# Patient Record
Sex: Female | Born: 1975 | Race: Black or African American | Hispanic: No | Marital: Single | Smoking: Current every day smoker
Health system: Southern US, Community
[De-identification: ages and names within clinical notes are randomized; demographics above are authoritative.]

## PROBLEM LIST (undated history)

## (undated) HISTORY — PX: TUBAL LIGATION: SHX77

## (undated) HISTORY — PX: LAPAROSCOPIC GASTRIC BANDING: SHX1100

---

## 1997-12-02 ENCOUNTER — Inpatient Hospital Stay (HOSPITAL_COMMUNITY): Admission: AD | Admit: 1997-12-02 | Discharge: 1997-12-02 | Payer: Self-pay | Admitting: Obstetrics & Gynecology

## 1997-12-08 ENCOUNTER — Other Ambulatory Visit: Admission: RE | Admit: 1997-12-08 | Discharge: 1997-12-08 | Payer: Self-pay | Admitting: Obstetrics & Gynecology

## 1998-05-24 ENCOUNTER — Inpatient Hospital Stay (HOSPITAL_COMMUNITY): Admission: AD | Admit: 1998-05-24 | Discharge: 1998-05-27 | Payer: Self-pay | Admitting: Obstetrics and Gynecology

## 1999-09-16 ENCOUNTER — Emergency Department (HOSPITAL_COMMUNITY): Admission: EM | Admit: 1999-09-16 | Discharge: 1999-09-16 | Payer: Self-pay | Admitting: Family Medicine

## 2001-03-19 ENCOUNTER — Emergency Department (HOSPITAL_COMMUNITY): Admission: EM | Admit: 2001-03-19 | Discharge: 2001-03-19 | Payer: Self-pay | Admitting: Emergency Medicine

## 2001-08-13 ENCOUNTER — Emergency Department (HOSPITAL_COMMUNITY): Admission: EM | Admit: 2001-08-13 | Discharge: 2001-08-13 | Payer: Self-pay | Admitting: Emergency Medicine

## 2003-10-20 ENCOUNTER — Other Ambulatory Visit: Admission: RE | Admit: 2003-10-20 | Discharge: 2003-10-20 | Payer: Self-pay | Admitting: Obstetrics and Gynecology

## 2004-05-16 ENCOUNTER — Inpatient Hospital Stay (HOSPITAL_COMMUNITY): Admission: AD | Admit: 2004-05-16 | Discharge: 2004-05-16 | Payer: Self-pay | Admitting: *Deleted

## 2005-01-04 ENCOUNTER — Ambulatory Visit (HOSPITAL_COMMUNITY): Admission: RE | Admit: 2005-01-04 | Discharge: 2005-01-04 | Payer: Self-pay | Admitting: Obstetrics and Gynecology

## 2005-01-16 ENCOUNTER — Inpatient Hospital Stay (HOSPITAL_COMMUNITY): Admission: AD | Admit: 2005-01-16 | Discharge: 2005-01-19 | Payer: Self-pay | Admitting: Obstetrics and Gynecology

## 2005-01-17 ENCOUNTER — Encounter (INDEPENDENT_AMBULATORY_CARE_PROVIDER_SITE_OTHER): Payer: Self-pay | Admitting: *Deleted

## 2005-02-26 ENCOUNTER — Other Ambulatory Visit: Admission: RE | Admit: 2005-02-26 | Discharge: 2005-02-26 | Payer: Self-pay | Admitting: Obstetrics and Gynecology

## 2006-02-18 ENCOUNTER — Other Ambulatory Visit: Admission: RE | Admit: 2006-02-18 | Discharge: 2006-02-18 | Payer: Self-pay | Admitting: Family Medicine

## 2007-04-01 ENCOUNTER — Ambulatory Visit (HOSPITAL_COMMUNITY): Admission: RE | Admit: 2007-04-01 | Discharge: 2007-04-01 | Payer: Self-pay | Admitting: General Surgery

## 2007-05-13 ENCOUNTER — Encounter: Admission: RE | Admit: 2007-05-13 | Discharge: 2007-05-13 | Payer: Self-pay | Admitting: General Surgery

## 2007-05-21 ENCOUNTER — Other Ambulatory Visit: Admission: RE | Admit: 2007-05-21 | Discharge: 2007-05-21 | Payer: Self-pay | Admitting: Family Medicine

## 2007-09-24 ENCOUNTER — Encounter: Admission: RE | Admit: 2007-09-24 | Discharge: 2007-12-23 | Payer: Self-pay | Admitting: General Surgery

## 2007-10-07 ENCOUNTER — Ambulatory Visit (HOSPITAL_COMMUNITY): Admission: RE | Admit: 2007-10-07 | Discharge: 2007-10-08 | Payer: Self-pay | Admitting: General Surgery

## 2010-11-14 NOTE — Op Note (Signed)
NAMEANARELY, NICHOLLS             ACCOUNT NO.:  [id number]   MEDICAL RECORD NO.:  [id number]          PATIENT TYPE:  AMB   LOCATION:  DAY                          FACILITY:  [facility]   PHYSICIAN:  Sharlet Salina T. Hoxworth, M.D.DATE OF BIRTH:  May 18, 1976   DATE OF PROCEDURE:  10/07/2007  DATE OF DISCHARGE:                               OPERATIVE REPORT   PREOPERATIVE DIAGNOSES:  Morbid obesity.   POSTOPERATIVE DIAGNOSES:  Morbid obesity.   SURGICAL PROCEDURES:  Laparoscopic adjustable gastric band placement  with hiatal hernia repair.   SURGEON:  Dr. Glenna Fellows.   ASSISTANT:  Dr. Ovidio Kin.   ANESTHESIA:  General.   BRIEF HISTORY:  Ms. Poncedeleon is a 35 year old female with progressive  morbid obesity that is unresponsive to multiple attempts at nonsurgical  management.  After extensive discussion of options and workup detailed  elsewhere, we have elected to proceed with placement of laparoscopic  adjustable gastric band.  She is brought to the operating room for this  procedure.   DESCRIPTION OF PROCEDURE:  The patient was brought to the operating  room, placed in supine position on the operating table and general  endotracheal anesthesia was induced.  The abdomen was widely sterilely  prepped and draped.  She was given preoperative antibiotics.  Heparin  was administered subcutaneously.  PAS were in place.  The correct  patient and procedure were verified.  Incisions were infiltrated with  local anesthesia.  Through a 1-cm site at the left subcostal area mid  clavicular line, access was obtained with an OptiVu trocar without  difficulty and pneumoperitoneum established.  Under direct vision, a 15  mm trocar was placed in the right upper abdomen through the falciform  ligament and an 11 mm trocar in the right upper mid abdomen, another 11  mm trocar just to the left above the umbilicus for the camera port.  Through a 5-mm subxiphoid site, the [facility] retractor was  placed and  the left lobe of the liver elevated with excellent exposure of the upper  stomach and hiatus.  A 5 mm trocar was placed in the left flank. The  peritoneum overlying the left crus at the EG junction was incised and  careful blunt dissection was carried back toward the retrogastric area.  Following this, the pars flaccida was incised in the avascular area.  There was actually minimal fat up around the hiatus and upper stomach.  An APS band system was chosen.  The sizing tube was introduced orally  into the stomach and the balloon blown up to 15 cm and pulled back was  seen to go up through the hiatus through a small hiatal hernia.  I  therefore proceeded to repair this.  The anterior border of the right  crus was mobilized toward the esophagus, the retroesophageal space  entered and the left crus identified, dissected up toward the esophagus  and hiatal hernia well-defined which was small but definite.  This was  repaired with a single 2-0 Ethibond endostitch suture with closure back  toward a normal size.  Following this, the tube was introduced back into  the  stomach, the balloon inflated and this time pulled snugly back into  the hiatus without going through.  The balloon was deflated and the tube  pulled back into the upper esophagus.  A point on the lower right crus  of crossing fat was identified, the peritoneum incised and the finger  dissector passed easily into this space retrogastric and deployed up  through the previous dissected area of angle of Hiss without difficulty.  An APS flush band system was introduced in the abdomen, the tubing  brought behind the stomach with the finger tractor and the band brought  behind the stomach without difficulty.  With the sizing tube back in  place through the EG junction,  the band was locked into place without  any undue tightness.  The sizing balloon was removed.  Beginning near  the angle of Hiss, the fundus was imbricated up  over the band to the  small gastric pouch with three interrupted 2-0 Ethibond sutures.  The  band appeared to be in excellent position.  There was no bleeding.  There was no evidence of trocar injury.  The Nathanson retractor was  removed.  Trocars removed and all CO2 evacuated after bringing the  tubing out through the right mid abdominal trocar site.  This incision  was lengthened somewhat and the anterior fascia exposed.  Four 2-0  Prolene stay sutures were placed.  The tubing was cut, attached to the  port and then the port sutured to the anterior abdominal wall with the  previously placed sutures. This wound was irrigated.  The subcu at this  site was closed with running 3-0 Vicryl.  The skin incisions were closed  with subcuticular Monocryl with Dermabond. Sponge, needle and instrument  counts were correct.  The patient was taken to recovery in good  condition.      Lorne Skeens. Hoxworth, M.D.  Electronically Signed     BTH/MEDQ  D:  10/07/2007  T:  10/08/2007  Job:  [id number]

## 2010-11-17 NOTE — Discharge Summary (Signed)
Kaitlin Gilmore, Kaitlin Gilmore             ACCOUNT NO.:  [id number]   MEDICAL RECORD NO.:  [id number]          PATIENT TYPE:  INP   LOCATION:  9134                          FACILITY:  [facility]   PHYSICIAN:  Naima A. Dillard, M.D. DATE OF BIRTH:  [birth year]   DATE OF ADMISSION:  01/16/2005  DATE OF DISCHARGE:  01/19/2005                                 DISCHARGE SUMMARY   ADMISSION DIAGNOSES:  1.  Intrauterine pregnancy at 40-4/7 weeks.  2.  Early labor.  3.  Spontaneous rupture of membranes.   DISCHARGE DIAGNOSES:  1.  Intrauterine pregnancy at 40-4/7 weeks.  2.  Early labor.  3.  Spontaneous rupture of membranes.  4.  Status post spontaneous vaginal delivery.  5.  Retained placental fragment with manual removal.  6.  Desires elective tubal ligation.   HOSPITAL PROCEDURES:  1.  Electronic fetal monitoring.  2.  Spontaneous vaginal delivery of a viable female infant named T'Nijah.      Apgars 8 and 9 weighing 8 pounds 6 ounces.  3.  Removal of retained placental fragment.  4.  Epidural anesthesia.  5.  Postpartum tubal ligation.   HOSPITAL COURSE:  Patient was admitted in early labor.  While walking her  membranes ruptured and her cervix progressed to 4.5 cm.  She progressed  steadily throughout the evening to vaginal delivery of a viable female  infant, Apgars 8 and 9 weighing 8 pounds 6 ounces remarkable for tight  shoulders requiring McRoberts maneuver that were delivered without incident.  She did have a retained placental fragment with bleeding and this was  removed manually with no further complications afterwards.  She had a small  midline first degree laceration which was repaired.  She was requesting  elective tubal ligation and this was performed under epidural anesthesia  without complications.  On postpartum day #1 she was doing well, up ad lib,  bottle feeding her infant, tolerating a regular diet.  Hemoglobin was 9.5  and she was given routine postpartum and postoperative  care.  On  postoperative day #2 she continued to improve.  Vital signs were stable.  Chest was clear.  Heart rate regular rate and rhythm.  Abdomen soft and  appropriately tender.  Incision was clean, dry, and intact.  Lochia was  small.  Extremities within normal limits.  She was deemed to have received  the full benefit of her hospital stay and was discharged home.   DISCHARGE MEDICATIONS:  1.  Motrin 600 mg p.o. q.6h. p.r.n.  2.  Tylox one to two p.o. q.4h. p.r.n.   DISCHARGE LABORATORIES:  White blood cell count 10.1, hemoglobin 9.5,  platelets 244.  RPR nonreactive.   DISCHARGE INSTRUCTIONS:  Per [facility] handout.   DISCHARGE FOLLOW-UP:  In six weeks or p.r.n.       MLW/MEDQ  D:  01/19/2005  T:  01/19/2005  Job:  [id number]

## 2010-11-17 NOTE — Op Note (Signed)
Kaitlin Gilmore, Kaitlin Gilmore             ACCOUNT NO.:  [id number]   MEDICAL RECORD NO.:  [id number]          PATIENT TYPE:  INP   LOCATION:  9134                          FACILITY:  [facility]   PHYSICIAN:  Naima A. Dillard, M.D. DATE OF BIRTH:  07-Jul-1975   DATE OF PROCEDURE:  01/17/2005  DATE OF DISCHARGE:                                 OPERATIVE REPORT   PREOPERATIVE DIAGNOSIS:  Multiparity, desires tubal ligation.   POSTOPERATIVE DIAGNOSIS:  Multiparity, desires tubal ligation.   PROCEDURE:  Postpartum tubal ligation.   SURGEON:  Naima A. Normand Sloop, M.D.   ANESTHESIA:  Epidural.   ESTIMATED BLOOD LOSS:  Minimal.   IV FLUIDS:  1100 mL crystalloid.   COMPLICATIONS:  None.   FINDINGS:  Normal-appearing fallopian tubes bilaterally.  Before the  procedure, the patient was told that this procedure was meant to be  permanent.  The risks were but not limited to bleeding, infection, damage to  internal organs such as bowel, bladder and major blood vessels, and a  failure rate of about one in 200 to one in 300, and half of the failures  could result in ectopic pregnancy, which the patient understood could be a  pregnancy in her tube.   PROCEDURE IN DETAIL:  The patient was taken to the operating room.  Her  epidural anesthesia was found to be adequate.  She was prepped and draped in  a normal sterile fashion.  A 10 mm infraumbilical horizontal incision was  made with a scalpel and carried down to the fascia.  The fascia was incised  in the midline and extended bilaterally.  The peritoneum was identified,  tented up and entered sharply.  It was extended bilaterally.  Appendiceal  retractor was placed into the abdominal cavity and the patient's right  fallopian tube was identified, grasped with Babcock clamps, followed out to  the fimbriated end, and about a centimeter of the midisthmic portion of the  tube was ligated with 2-0 plain and excised.  Hemostasis was assured.  The  patient's left  fallopian tube was identified in a similar fashion, grasped  with a Babcock clamp, and about 1 cm of the midisthmic portion of the tube  was ligated and excised with 2-0 plain.  Hemostasis was assured.  The tubes  were returned to the abdomen.  The fascia was closed with 0 Vicryl.  The  skin was closed with 3-0 Monocryl in a subcuticular fashion.  The sponge,  lap and needle counts were correct x2.  The patient went to the recovery  room in stable condition.     NAD/MEDQ  D:  01/17/2005  T:  01/17/2005  Job:  [id number]

## 2010-11-17 NOTE — H&P (Signed)
NAMEJISELLA, ASHENFELTER             ACCOUNT NO.:  [id number]   MEDICAL RECORD NO.:  [id number]          PATIENT TYPE:  INP   LOCATION:  9169                          FACILITY:  [facility]   PHYSICIAN:  Osborn Coho, M.D.   DATE OF BIRTH:  10/15/1975   DATE OF ADMISSION:  01/16/2005  DATE OF DISCHARGE:                                HISTORY & PHYSICAL   This is a 35 year old gravida 3, para 0-1-1-1, at 40-4/7 weeks who presented  with contractions every 5 minutes for several hours.  Cervix was 2 cm on  admission.  She was sent to walk for an hour.  During her walking, she  ruptured her membranes for clear fluid, and her cervix is now 4.5 cm, and  she is therefore being admitted for labor.   OBSTETRICAL HISTORY:  Remarkable for a vaginal delivery in 1999 of a female  infant at 79 weeks' gestation, weighing 5 pounds 5 ounces, remarkable for  induction of labor secondary to preeclampsia.  She had an elective abortion  in 2001.   MEDICAL HISTORY:  1.  Remarkable for gonorrhea and Chlamydia 5 years ago and second trimester      Chlamydia during this pregnancy.  2.  She had childhood Varicella.  3.  She was a smoker until the beginning of this pregnancy.   FAMILY HISTORY:  Remarkable for grandmother, grandfather on both sides with  hypertension.  Mother with epilepsy.  Mother and father with crack use and  alcohol.   SURGICAL HISTORY:  Elective AB in 2001.   GENETIC HISTORY:  Remarkable for father of the baby's brother with twins.   SOCIAL HISTORY:  The patient is single.  Father of the baby, Laurey Arrow,  who is involved and supportive.  She does not report a religious  affiliation.  She denies any current alcohol, tobacco, or drug use.   PRENATAL LABS:  Hemoglobin 12.5, platelets 311, blood type O positive,  antibody screen negative, sickle cell negative, RPR nonreactive, rubella  immune, hepatitis negative, HIV negative, Pap test normal, gonorrhea  negative, Chlamydia negative,  cystic fibrosis negative.   HISTORY OF CURRENT PREGNANCY:  The patient entered care at 10 weeks'  gestation.  She was diagnosed with Chlamydia in January at 15 weeks  pregnancy and treated appropriately.  Test of cure was negative 3 weeks  later.  Anatomy ultrasound was normal.  She had another ultrasound at 28  weeks for size greater than dates, and the baby measured 79th percentile.  Glucola was elevated, and 3 hour GTT was normal.  She had another ultrasound  at 34 weeks for size greater than dates and measured only slightly above.  She had another ultrasound at 37 weeks for presentation and growth, and she  measured 59-65th percentile.  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   OBJECTIVE DATA:  VITAL SIGNS:  Stable, afebrile.  HEENT:  Within normal limits.  Thyroid normal and not enlarged.  CHEST:  Clear  to auscultation.  HEART:  Regular rate and rhythm.  ABDOMEN:  Gravid at 40 cm, vertex, Leopold's.  EFM shows fetal heart rate  which is reactive with no decelerations.  Uterine contractions are every 3  minutes and moderately strong.  Cervix is now 4.5 cm, 90% effaced, -2  station, vertex presentation.  EXTREMITIES:  Within normal limits.   ASSESSMENT:  1.  Intrauterine pregnancy at 40-4/7 weeks.  2.  Active labor.  3.  Spontaneous rupture of membranes.  4.  Group B strep negative.   PLAN:  1.  Admit to birthing suite, Dr. Su Hilt notified.  2.  Routine CNM orders.  3.  Epidural p.r.n.       MLW/MEDQ  D:  01/16/2005  T:  01/16/2005  Job:  [id number]

## 2011-01-06 ENCOUNTER — Inpatient Hospital Stay (INDEPENDENT_AMBULATORY_CARE_PROVIDER_SITE_OTHER)
Admission: RE | Admit: 2011-01-06 | Discharge: 2011-01-06 | Disposition: A | Discharge status: Home / Self Care | Payer: PRIVATE HEALTH INSURANCE | Source: Ambulatory Visit | Attending: Emergency Medicine | Admitting: Emergency Medicine

## 2011-01-06 DIAGNOSIS — K5289 Other specified noninfective gastroenteritis and colitis: Secondary | ICD-10-CM

## 2011-01-06 LAB — DIFFERENTIAL
Basophils Absolute: 0.1 10*3/uL (ref 0.0–0.1)
Basophils Relative: 1 % (ref 0–1)
Eosinophils Absolute: 0.1 10*3/uL (ref 0.0–0.7)
Eosinophils Relative: 2 % (ref 0–5)
Lymphocytes Relative: 51 % — ABNORMAL HIGH (ref 12–46)
Lymphs Abs: 2.8 10*3/uL (ref 0.7–4.0)
Monocytes Absolute: 0.5 10*3/uL (ref 0.1–1.0)
Monocytes Relative: 9 % (ref 3–12)
Neutro Abs: 2 10*3/uL (ref 1.7–7.7)
Neutrophils Relative %: 36 % — ABNORMAL LOW (ref 43–77)

## 2011-01-06 LAB — POCT I-STAT, CHEM 8
BUN: 9 mg/dL (ref 6–23)
Calcium, Ion: 1.16 mmol/L (ref 1.12–1.32)
Chloride: 102 mEq/L (ref 96–112)
Creatinine, Ser: 0.8 mg/dL (ref 0.50–1.10)
Glucose, Bld: 78 mg/dL (ref 70–99)
HCT: 40 % (ref 36.0–46.0)
Hemoglobin: 13.6 g/dL (ref 12.0–15.0)
Potassium: 3.8 mEq/L (ref 3.5–5.1)
Sodium: 138 mEq/L (ref 135–145)
TCO2: 26 mmol/L (ref 0–100)

## 2011-01-06 LAB — CBC
HCT: 35.4 % — ABNORMAL LOW (ref 36.0–46.0)
Hemoglobin: 12.1 g/dL (ref 12.0–15.0)
MCH: 29.2 pg (ref 26.0–34.0)
MCHC: 34.2 g/dL (ref 30.0–36.0)
MCV: 85.3 fL (ref 78.0–100.0)
Platelets: 286 10*3/uL (ref 150–400)
RBC: 4.15 MIL/uL (ref 3.87–5.11)
RDW: 14.4 % (ref 11.5–15.5)
WBC: 5.4 10*3/uL (ref 4.0–10.5)

## 2011-01-06 LAB — POCT URINALYSIS DIP (DEVICE)
Bilirubin Urine: NEGATIVE
Glucose, UA: NEGATIVE mg/dL
Hgb urine dipstick: NEGATIVE
Ketones, ur: NEGATIVE mg/dL
Leukocytes, UA: NEGATIVE
Nitrite: NEGATIVE
Protein, ur: NEGATIVE mg/dL
Specific Gravity, Urine: 1.02 (ref 1.005–1.030)
Urobilinogen, UA: 1 mg/dL (ref 0.0–1.0)
pH: 7 (ref 5.0–8.0)

## 2011-01-06 LAB — POCT PREGNANCY, URINE: Preg Test, Ur: NEGATIVE

## 2011-03-27 LAB — CBC
HCT: 36.8
Hemoglobin: 12.8
MCHC: 34.8
MCV: 86.6
Platelets: 271
RBC: 4.25
RDW: 13.9
WBC: 7.3

## 2011-03-27 LAB — DIFFERENTIAL
Basophils Absolute: 0
Basophils Relative: 0
Eosinophils Absolute: 0
Eosinophils Relative: 0
Lymphocytes Relative: 12
Lymphs Abs: 0.9
Monocytes Absolute: 0.2
Monocytes Relative: 3
Neutro Abs: 6.2
Neutrophils Relative %: 85 — ABNORMAL HIGH

## 2011-03-27 LAB — HEMOGLOBIN AND HEMATOCRIT, BLOOD
HCT: 39.1
Hemoglobin: 13.3

## 2011-03-27 LAB — PREGNANCY, URINE: Preg Test, Ur: NEGATIVE

## 2011-04-18 ENCOUNTER — Inpatient Hospital Stay (INDEPENDENT_AMBULATORY_CARE_PROVIDER_SITE_OTHER)
Admission: RE | Admit: 2011-04-18 | Discharge: 2011-04-18 | Disposition: A | Discharge status: Home / Self Care | Payer: PRIVATE HEALTH INSURANCE | Source: Ambulatory Visit | Attending: Family Medicine | Admitting: Family Medicine

## 2011-04-18 DIAGNOSIS — J069 Acute upper respiratory infection, unspecified: Secondary | ICD-10-CM

## 2012-07-03 ENCOUNTER — Telehealth (INDEPENDENT_AMBULATORY_CARE_PROVIDER_SITE_OTHER): Payer: Self-pay | Admitting: General Surgery

## 2012-07-03 NOTE — Telephone Encounter (Signed)
05/23/12 mailed recall letter for bariatric surgery follow-up to pt. Advised pt to call [facility] at [phone] to °schedule appt. (lss) ° °

## 2014-09-28 ENCOUNTER — Other Ambulatory Visit (HOSPITAL_COMMUNITY)
Admission: RE | Admit: 2014-09-28 | Discharge: 2014-09-28 | Disposition: A | Discharge status: Home / Self Care | Payer: 59 | Source: Ambulatory Visit | Attending: Family Medicine | Admitting: Family Medicine

## 2014-09-28 ENCOUNTER — Other Ambulatory Visit: Payer: Self-pay | Admitting: Family Medicine

## 2014-09-28 DIAGNOSIS — Z01419 Encounter for gynecological examination (general) (routine) without abnormal findings: Secondary | ICD-10-CM | POA: Diagnosis not present

## 2014-09-30 LAB — CYTOLOGY - PAP

## 2015-07-02 ENCOUNTER — Emergency Department (HOSPITAL_COMMUNITY)
Admission: EM | Admit: 2015-07-02 | Discharge: 2015-07-02 | Disposition: A | Discharge status: Home / Self Care | Payer: 59 | Attending: Emergency Medicine | Admitting: Emergency Medicine

## 2015-07-02 ENCOUNTER — Emergency Department (HOSPITAL_COMMUNITY): Payer: 59

## 2015-07-02 ENCOUNTER — Encounter (HOSPITAL_COMMUNITY): Payer: Self-pay | Admitting: Emergency Medicine

## 2015-07-02 ENCOUNTER — Emergency Department (INDEPENDENT_AMBULATORY_CARE_PROVIDER_SITE_OTHER)
Admission: EM | Admit: 2015-07-02 | Discharge: 2015-07-02 | Disposition: A | Discharge status: Nursing Home (Includes ICF) | Payer: 59 | Source: Home / Self Care

## 2015-07-02 DIAGNOSIS — R109 Unspecified abdominal pain: Secondary | ICD-10-CM

## 2015-07-02 DIAGNOSIS — R1031 Right lower quadrant pain: Secondary | ICD-10-CM

## 2015-07-02 DIAGNOSIS — F17210 Nicotine dependence, cigarettes, uncomplicated: Secondary | ICD-10-CM | POA: Diagnosis not present

## 2015-07-02 DIAGNOSIS — Z79899 Other long term (current) drug therapy: Secondary | ICD-10-CM | POA: Diagnosis not present

## 2015-07-02 DIAGNOSIS — R110 Nausea: Secondary | ICD-10-CM | POA: Insufficient documentation

## 2015-07-02 LAB — URINE MICROSCOPIC-ADD ON

## 2015-07-02 LAB — COMPREHENSIVE METABOLIC PANEL
ALT: 14 U/L (ref 14–54)
AST: 21 U/L (ref 15–41)
Albumin: 3.8 g/dL (ref 3.5–5.0)
Alkaline Phosphatase: 54 U/L (ref 38–126)
Anion gap: 8 (ref 5–15)
BUN: 9 mg/dL (ref 6–20)
CO2: 25 mmol/L (ref 22–32)
Calcium: 9 mg/dL (ref 8.9–10.3)
Chloride: 106 mmol/L (ref 101–111)
Creatinine, Ser: 0.85 mg/dL (ref 0.44–1.00)
GFR calc Af Amer: >60 mL/min (ref >60)
GFR calc non Af Amer: >60 mL/min (ref >60)
Glucose, Bld: 125 mg/dL — ABNORMAL HIGH (ref 65–99)
Potassium: 3.8 mmol/L (ref 3.5–5.1)
Sodium: 139 mmol/L (ref 135–145)
Total Bilirubin: 0.4 mg/dL (ref 0.3–1.2)
Total Protein: 7.2 g/dL (ref 6.5–8.1)

## 2015-07-02 LAB — PREGNANCY, URINE: Preg Test, Ur: NEGATIVE

## 2015-07-02 LAB — CBC
HCT: 32.8 % — ABNORMAL LOW (ref 36.0–46.0)
Hemoglobin: 10 g/dL — ABNORMAL LOW (ref 12.0–15.0)
MCH: 23.2 pg — ABNORMAL LOW (ref 26.0–34.0)
MCHC: 30.5 g/dL (ref 30.0–36.0)
MCV: 76.1 fL — ABNORMAL LOW (ref 78.0–100.0)
Platelets: 303 10*3/uL (ref 150–400)
RBC: 4.31 MIL/uL (ref 3.87–5.11)
RDW: 20.4 % — ABNORMAL HIGH (ref 11.5–15.5)
WBC: 13.3 10*3/uL — ABNORMAL HIGH (ref 4.0–10.5)

## 2015-07-02 LAB — URINALYSIS, ROUTINE W REFLEX MICROSCOPIC
Bilirubin Urine: NEGATIVE
Glucose, UA: NEGATIVE mg/dL
Ketones, ur: 15 mg/dL — AB
Nitrite: NEGATIVE
Protein, ur: NEGATIVE mg/dL
Specific Gravity, Urine: 1.022 (ref 1.005–1.030)
pH: 6 (ref 5.0–8.0)

## 2015-07-02 LAB — LIPASE, BLOOD: Lipase: 19 U/L (ref 11–51)

## 2015-07-02 LAB — POC URINE PREG, ED: Preg Test, Ur: NEGATIVE

## 2015-07-02 MED ORDER — FUROSEMIDE 10 MG/ML IJ SOLN
40.0000 mg | Freq: Once | INTRAMUSCULAR | Status: AC
  Administered 2015-07-02: 40 mg via INTRAVENOUS
  Filled 2015-07-02: qty 4

## 2015-07-02 MED ORDER — IOHEXOL 300 MG/ML  SOLN
100.0000 mL | Freq: Once | INTRAMUSCULAR | Status: AC | PRN
  Administered 2015-07-02: 75 mL via INTRAVENOUS

## 2015-07-02 MED ORDER — PANTOPRAZOLE SODIUM 40 MG IV SOLR: 40.0000 mg | Freq: Once | INTRAVENOUS | Status: DC

## 2015-07-02 MED ORDER — KETOROLAC TROMETHAMINE 15 MG/ML IJ SOLN
15.0000 mg | Freq: Once | INTRAMUSCULAR | Status: AC
  Administered 2015-07-02: 15 mg via INTRAVENOUS
  Filled 2015-07-02: qty 1

## 2015-07-02 MED ORDER — GI COCKTAIL ~~LOC~~: 30.0000 mL | Freq: Once | ORAL | Status: DC

## 2015-07-02 MED ORDER — HYDROMORPHONE HCL 1 MG/ML IJ SOLN: 1.0000 mg | Freq: Once | INTRAMUSCULAR | Status: DC

## 2015-07-02 MED ORDER — HYDROMORPHONE HCL 1 MG/ML IJ SOLN
1.0000 mg | Freq: Once | INTRAMUSCULAR | Status: AC
  Administered 2015-07-02: 1 mg via INTRAVENOUS
  Filled 2015-07-02: qty 1

## 2015-07-02 MED ORDER — SODIUM CHLORIDE 0.9 % IV BOLUS (SEPSIS): 1000.0000 mL | Freq: Once | INTRAVENOUS | Status: DC

## 2015-07-02 MED ORDER — SODIUM CHLORIDE 0.9 % IV BOLUS (SEPSIS)
1000.0000 mL | Freq: Once | INTRAVENOUS | Status: AC
  Administered 2015-07-02: 1000 mL via INTRAVENOUS

## 2015-07-02 MED ORDER — FUROSEMIDE 20 MG PO TABS: 20.0000 mg | ORAL_TABLET | Freq: Two times a day (BID) | ORAL | Status: AC

## 2015-07-02 MED ORDER — FAMOTIDINE IN NACL 20-0.9 MG/50ML-% IV SOLN: 20.0000 mg | Freq: Once | INTRAVENOUS | Status: DC

## 2015-07-02 MED ORDER — TRAMADOL HCL 50 MG PO TABS: 50.0000 mg | ORAL_TABLET | Freq: Four times a day (QID) | ORAL | Status: AC | PRN

## 2015-07-02 NOTE — ED Notes (Signed)
[facility] with a complaint of lower right abdominal pain that started today. The patient stated that she has had abnormal menstrual cycles.

## 2015-07-02 NOTE — Discharge Instructions (Signed)
YOU NEED TO GO TO THE ER FOR EVALUATION

## 2015-07-02 NOTE — ED Provider Notes (Signed)
CSN: [id number]     Arrival date & time 07/02/15  1118 History   None    Chief Complaint  Patient presents with  . Abdominal Pain   (Consider location/radiation/quality/duration/timing/severity/associated sxs/prior Treatment) HPI Tender right LQ pain in this adult female with onset of symptoms this morning. Pain score is 8 and causes crying. Onset of menses 2 days ago, and menses very heavy Previous Gastric banding and tubal ligation  No past medical history on file. Past Surgical History  Procedure Laterality Date  . Laparoscopic gastric banding    . Tubal ligation     No family history on file. Social History  Substance Use Topics  . Smoking status: Current Every Day Smoker -- 0.50 packs/day    Types: Cigarettes  . Smokeless tobacco: Not on file  . Alcohol Use: Yes     Comment: socially   OB History    No data available     Review of Systems Abdominal pain No vomiting, diarrhea Allergies  Review of patient's allergies indicates no known allergies.  Home Medications   Prior to Admission medications   Medication Sig Start Date End Date Taking? Authorizing Provider  ferrous sulfate 325 (65 FE) MG EC tablet Take 325 mg by mouth 3 (three) times daily with meals.   Yes Historical Provider, MD   Meds Ordered and Administered this Visit  Medications - No data to display  BP 150/89 mmHg  Pulse 69  Temp(Src) 97.6 F (36.4 C) (Oral)  Resp 20  SpO2 100%  LMP 06/29/2015 (Exact Date) No data found.   Physical Exam  Constitutional: She is oriented to person, place, and time. She appears well-developed and well-nourished. She appears distressed.  HENT:  Head: Atraumatic.  Pulmonary/Chest: Effort normal.  Abdominal: Soft. There is tenderness. There is guarding. There is no rebound.  Musculoskeletal: Normal range of motion.  Neurological: She is alert and oriented to person, place, and time.  Skin: Skin is warm and dry.  Nursing note and vitals reviewed.   ED  Course  Procedures (including critical care time)  Labs Review Labs Reviewed - No data to display  Imaging Review No results found.   Visual Acuity Review  Right Eye Distance:   Left Eye Distance:   Bilateral Distance:    Right Eye Near:   Left Eye Near:    Bilateral Near:         MDM   1. Right lower quadrant abdominal pain    With the amount of pain and localized symptoms patient exhibits. I have advised her it is in her best interest to be seen in the emergency department.  Pt declines transport and states she is capable of getting herself to the ED.     Tharon AquasFrank C Patrick, PA 07/02/15 1324

## 2015-07-02 NOTE — ED Notes (Signed)
Patient states right lower quadrant pain.    Patient states had not had a period in three months, but came on this week.  Patient states no chance of pregnancy.   Denies urinary symptoms.

## 2015-07-02 NOTE — Discharge Instructions (Signed)

## 2015-07-02 NOTE — ED Notes (Signed)
Patient transported to CT 

## 2015-07-15 NOTE — ED Provider Notes (Signed)
CSN: [id number]     Arrival date & time 07/02/15  1157 History   First MD Initiated Contact with Patient 07/02/15 1305     Chief Complaint  Patient presents with  . Abdominal Pain     (Consider location/radiation/quality/duration/timing/severity/associated sxs/prior Treatment) HPI   40 year old female with abdominal pain. Onset this morning. Progressively worsening. Pain is in the right lower quadrant. Constant. No appreciable exacerbating relieving factors. Does not radiate. No fevers or chills. Nausea, but no vomiting. Surgical history for laparoscopic gastric banding. No unusual vaginal bleeding or discharge. No urinary complaints.  History reviewed. No pertinent past medical history. Past Surgical History  Procedure Laterality Date  . Laparoscopic gastric banding    . Tubal ligation     No family history on file. Social History  Substance Use Topics  . Smoking status: Current Every Day Smoker -- 0.50 packs/day    Types: Cigarettes  . Smokeless tobacco: None  . Alcohol Use: Yes     Comment: socially   OB History    No data available     Review of Systems  All systems reviewed and negative, other than as noted in HPI.   Allergies  Review of patient's allergies indicates no known allergies.  Home Medications   Prior to Admission medications   Medication Sig Start Date End Date Taking? Authorizing Provider  Cholecalciferol (VITAMIN D PO) Take 1,000 Units by mouth daily.   Yes Historical Provider, MD  ferrous sulfate 325 (65 FE) MG EC tablet Take 325 mg by mouth 3 (three) times daily with meals.   Yes Historical Provider, MD  zolpidem (AMBIEN) 10 MG tablet Take 10 mg by mouth at bedtime as needed.   Yes Historical Provider, MD  furosemide (LASIX) 20 MG tablet Take 1 tablet (20 mg total) by mouth 2 (two) times daily. 07/02/15   Raeford Razor, MD  traMADol (ULTRAM) 50 MG tablet Take 1 tablet (50 mg total) by mouth every 6 (six) hours as needed. 07/02/15   Raeford Razor,  MD   BP 113/71 mmHg  Pulse 58  Temp(Src) 97.5 F (36.4 C) (Oral)  Resp 18  Ht 5\' 5"  (1.651 m)  Wt 136 lb (61.689 kg)  BMI 22.63 kg/m2  SpO2 100%  LMP 06/29/2015 Physical Exam  Constitutional: She appears well-developed and well-nourished. No distress.  HENT:  Head: Normocephalic and atraumatic.  Eyes: Conjunctivae are normal. Right eye exhibits no discharge. Left eye exhibits no discharge.  Neck: Neck supple.  Cardiovascular: Normal rate, regular rhythm and normal heart sounds.  Exam reveals no gallop and no friction rub.   No murmur heard. Pulmonary/Chest: Effort normal and breath sounds normal. No respiratory distress.  Abdominal: Soft. She exhibits no distension. There is tenderness.  Tenderness across lower abdomen, mildly worsened right lower quadrant. No rebound or guarding.  Musculoskeletal: She exhibits no edema or tenderness.  Neurological: She is alert.  Skin: Skin is warm and dry.  Psychiatric: She has a normal mood and affect. Her behavior is normal. Thought content normal.  Nursing note and vitals reviewed.   ED Course  Procedures (including critical care time) Labs Review Labs Reviewed  COMPREHENSIVE METABOLIC PANEL - Abnormal; Notable for the following:    Glucose, Bld 125 (*)    All other components within normal limits  CBC - Abnormal; Notable for the following:    WBC 13.3 (*)    Hemoglobin 10.0 (*)    HCT 32.8 (*)    MCV 76.1 (*)    [facility]  23.2 (*)    RDW 20.4 (*)    All other components within normal limits  URINALYSIS, ROUTINE W REFLEX MICROSCOPIC (NOT AT [facility]) - Abnormal; Notable for the following:    APPearance CLOUDY (*)    Hgb urine dipstick LARGE (*)    Ketones, ur 15 (*)    Leukocytes, UA SMALL (*)    All other components within normal limits  URINE MICROSCOPIC-ADD ON - Abnormal; Notable for the following:    Squamous Epithelial / LPF 0-5 (*)    Bacteria, UA FEW (*)    All other components within normal limits  LIPASE, BLOOD  PREGNANCY,  URINE  POC URINE PREG, ED    Imaging Review No results found.   Ct Abdomen Pelvis W Contrast  07/02/2015  CLINICAL DATA:  Right lower quadrant pain for 1 day EXAM: CT ABDOMEN AND PELVIS WITH CONTRAST TECHNIQUE: Multidetector CT imaging of the abdomen and pelvis was performed using the standard protocol following bolus administration of intravenous contrast. CONTRAST:  75mL OMNIPAQUE IOHEXOL 300 MG/ML  SOLN COMPARISON:  None. FINDINGS: The appendix is normal and best seen on coronal reformat images. A lap band is in place in the fundus of the stomach common 1.9 cm below the gastroesophageal junction. It is oriented at 2 o'clock. The catheter portion is grossly intact. There is low-density surrounding the hepatic portal triads compatible with periportal edema. There is stranding involving the subcutaneous and intra-abdominal fat compatible with anasarca. There is a small amount of free fluid layering in the pelvis. There are cystic changes in the ovaries. Uterus is within normal limits. Spleen, pancreas, adrenal glands, and kidneys are grossly within normal limits. IMPRESSION: Normal appendix. Anasarca. There is edema involving the patient's fat, free-fluid in the pelvis, and periportal edema, all most likely related to diffuse edema. Postoperative changes from lap band surgery. Electronically Signed   By: Jolaine ClickArthur  Hoss M.D.   On: 07/02/2015 14:49   I have personally reviewed and evaluated these images and lab results as part of my medical decision-making.   EKG Interpretation None      MDM   Final diagnoses:  Abdominal pain, unspecified abdominal location    40 year old female with lower abdominal pain. Etiology is not completely clear. CT with evidence of anasarca. Uncertain exact etiology. No known hepatic dysfunction. LFTs were normal. At this point I feel she is appropriate for discharge. Return precautions were discussed. Further outpatient    Raeford RazorStephen Shynice Sigel, MD 07/15/15 1237

## 2015-08-17 ENCOUNTER — Other Ambulatory Visit: Payer: Self-pay | Admitting: Family

## 2015-08-17 DIAGNOSIS — R9389 Abnormal findings on diagnostic imaging of other specified body structures: Secondary | ICD-10-CM

## 2015-08-24 ENCOUNTER — Ambulatory Visit
Admission: RE | Admit: 2015-08-24 | Discharge: 2015-08-24 | Disposition: A | Discharge status: Home / Self Care | Payer: BLUE CROSS/BLUE SHIELD | Source: Ambulatory Visit | Attending: Family | Admitting: Family

## 2015-08-24 DIAGNOSIS — R9389 Abnormal findings on diagnostic imaging of other specified body structures: Secondary | ICD-10-CM

## 2015-10-18 DIAGNOSIS — Z9884 Bariatric surgery status: Secondary | ICD-10-CM | POA: Diagnosis not present

## 2015-11-08 DIAGNOSIS — L723 Sebaceous cyst: Secondary | ICD-10-CM | POA: Diagnosis not present

## 2015-11-08 DIAGNOSIS — D509 Iron deficiency anemia, unspecified: Secondary | ICD-10-CM | POA: Diagnosis not present

## 2015-11-08 DIAGNOSIS — G4700 Insomnia, unspecified: Secondary | ICD-10-CM | POA: Diagnosis not present

## 2015-12-13 DIAGNOSIS — Z9884 Bariatric surgery status: Secondary | ICD-10-CM | POA: Diagnosis not present

## 2015-12-13 DIAGNOSIS — Z4651 Encounter for fitting and adjustment of gastric lap band: Secondary | ICD-10-CM | POA: Diagnosis not present

## 2016-05-08 ENCOUNTER — Encounter (HOSPITAL_COMMUNITY): Payer: Self-pay

## 2016-05-22 DIAGNOSIS — Z6830 Body mass index (BMI) 30.0-30.9, adult: Secondary | ICD-10-CM | POA: Diagnosis not present

## 2016-05-22 DIAGNOSIS — D509 Iron deficiency anemia, unspecified: Secondary | ICD-10-CM | POA: Diagnosis not present

## 2016-05-22 DIAGNOSIS — Z9884 Bariatric surgery status: Secondary | ICD-10-CM | POA: Diagnosis not present

## 2016-05-22 DIAGNOSIS — E669 Obesity, unspecified: Secondary | ICD-10-CM | POA: Diagnosis not present

## 2016-06-13 DIAGNOSIS — Z79899 Other long term (current) drug therapy: Secondary | ICD-10-CM | POA: Diagnosis not present

## 2016-06-13 DIAGNOSIS — R5383 Other fatigue: Secondary | ICD-10-CM | POA: Diagnosis not present

## 2016-06-13 DIAGNOSIS — R635 Abnormal weight gain: Secondary | ICD-10-CM | POA: Diagnosis not present

## 2016-06-13 DIAGNOSIS — R0602 Shortness of breath: Secondary | ICD-10-CM | POA: Diagnosis not present

## 2016-07-02 HISTORY — PX: AUGMENTATION MAMMAPLASTY: SUR837

## 2016-10-02 DIAGNOSIS — D509 Iron deficiency anemia, unspecified: Secondary | ICD-10-CM | POA: Diagnosis not present

## 2016-10-02 DIAGNOSIS — Z9884 Bariatric surgery status: Secondary | ICD-10-CM | POA: Diagnosis not present

## 2016-10-02 DIAGNOSIS — E669 Obesity, unspecified: Secondary | ICD-10-CM | POA: Diagnosis not present

## 2016-10-02 DIAGNOSIS — Z6830 Body mass index (BMI) 30.0-30.9, adult: Secondary | ICD-10-CM | POA: Diagnosis not present

## 2016-11-14 DIAGNOSIS — G4700 Insomnia, unspecified: Secondary | ICD-10-CM | POA: Diagnosis not present

## 2016-12-19 DIAGNOSIS — G4700 Insomnia, unspecified: Secondary | ICD-10-CM | POA: Diagnosis not present

## 2016-12-19 DIAGNOSIS — D508 Other iron deficiency anemias: Secondary | ICD-10-CM | POA: Diagnosis not present

## 2016-12-19 DIAGNOSIS — R635 Abnormal weight gain: Secondary | ICD-10-CM | POA: Diagnosis not present

## 2016-12-19 DIAGNOSIS — Z9884 Bariatric surgery status: Secondary | ICD-10-CM | POA: Diagnosis not present

## 2016-12-19 DIAGNOSIS — R5383 Other fatigue: Secondary | ICD-10-CM | POA: Diagnosis not present

## 2017-01-08 DIAGNOSIS — N926 Irregular menstruation, unspecified: Secondary | ICD-10-CM | POA: Diagnosis not present

## 2017-01-08 DIAGNOSIS — Z9884 Bariatric surgery status: Secondary | ICD-10-CM | POA: Diagnosis not present

## 2017-01-08 DIAGNOSIS — Z0000 Encounter for general adult medical examination without abnormal findings: Secondary | ICD-10-CM | POA: Diagnosis not present

## 2017-01-08 DIAGNOSIS — N920 Excessive and frequent menstruation with regular cycle: Secondary | ICD-10-CM | POA: Diagnosis not present

## 2017-01-08 DIAGNOSIS — Z114 Encounter for screening for human immunodeficiency virus [HIV]: Secondary | ICD-10-CM | POA: Diagnosis not present

## 2017-02-22 DIAGNOSIS — R8290 Unspecified abnormal findings in urine: Secondary | ICD-10-CM | POA: Diagnosis not present

## 2017-02-22 DIAGNOSIS — Z0000 Encounter for general adult medical examination without abnormal findings: Secondary | ICD-10-CM | POA: Diagnosis not present

## 2017-02-28 DIAGNOSIS — N920 Excessive and frequent menstruation with regular cycle: Secondary | ICD-10-CM | POA: Diagnosis not present

## 2017-02-28 DIAGNOSIS — N390 Urinary tract infection, site not specified: Secondary | ICD-10-CM | POA: Diagnosis not present

## 2017-04-22 DIAGNOSIS — L739 Follicular disorder, unspecified: Secondary | ICD-10-CM | POA: Diagnosis not present

## 2017-05-06 ENCOUNTER — Encounter (HOSPITAL_COMMUNITY): Payer: Self-pay

## 2017-11-13 DIAGNOSIS — R5382 Chronic fatigue, unspecified: Secondary | ICD-10-CM | POA: Diagnosis not present

## 2017-11-13 DIAGNOSIS — E669 Obesity, unspecified: Secondary | ICD-10-CM | POA: Diagnosis not present

## 2017-11-13 DIAGNOSIS — Z7901 Long term (current) use of anticoagulants: Secondary | ICD-10-CM | POA: Diagnosis not present

## 2017-11-13 DIAGNOSIS — N920 Excessive and frequent menstruation with regular cycle: Secondary | ICD-10-CM | POA: Diagnosis not present

## 2017-11-27 DIAGNOSIS — Z111 Encounter for screening for respiratory tuberculosis: Secondary | ICD-10-CM | POA: Diagnosis not present

## 2017-11-27 DIAGNOSIS — D649 Anemia, unspecified: Secondary | ICD-10-CM | POA: Diagnosis not present

## 2018-10-23 DIAGNOSIS — F5104 Psychophysiologic insomnia: Secondary | ICD-10-CM | POA: Diagnosis not present

## 2018-10-23 DIAGNOSIS — Z7689 Persons encountering health services in other specified circumstances: Secondary | ICD-10-CM | POA: Diagnosis not present

## 2019-03-26 DIAGNOSIS — D509 Iron deficiency anemia, unspecified: Secondary | ICD-10-CM | POA: Diagnosis not present

## 2019-03-26 DIAGNOSIS — Z113 Encounter for screening for infections with a predominantly sexual mode of transmission: Secondary | ICD-10-CM | POA: Diagnosis not present

## 2019-03-26 DIAGNOSIS — B353 Tinea pedis: Secondary | ICD-10-CM | POA: Diagnosis not present

## 2019-03-26 DIAGNOSIS — Z0000 Encounter for general adult medical examination without abnormal findings: Secondary | ICD-10-CM | POA: Diagnosis not present

## 2019-03-26 DIAGNOSIS — Z23 Encounter for immunization: Secondary | ICD-10-CM | POA: Diagnosis not present

## 2019-03-26 DIAGNOSIS — Z09 Encounter for follow-up examination after completed treatment for conditions other than malignant neoplasm: Secondary | ICD-10-CM | POA: Diagnosis not present

## 2019-03-26 DIAGNOSIS — Z01419 Encounter for gynecological examination (general) (routine) without abnormal findings: Secondary | ICD-10-CM | POA: Diagnosis not present

## 2019-03-26 DIAGNOSIS — N6325 Unspecified lump in the left breast, overlapping quadrants: Secondary | ICD-10-CM | POA: Diagnosis not present

## 2019-03-26 DIAGNOSIS — N6323 Unspecified lump in the left breast, lower outer quadrant: Secondary | ICD-10-CM | POA: Diagnosis not present

## 2019-05-06 ENCOUNTER — Other Ambulatory Visit: Payer: Self-pay

## 2019-05-06 DIAGNOSIS — Z20822 Contact with and (suspected) exposure to covid-19: Secondary | ICD-10-CM

## 2019-05-07 LAB — NOVEL CORONAVIRUS, NAA: SARS-CoV-2, NAA: NOT DETECTED

## 2019-06-24 ENCOUNTER — Other Ambulatory Visit: Payer: BLUE CROSS/BLUE SHIELD

## 2019-09-11 ENCOUNTER — Ambulatory Visit: Payer: Self-pay | Attending: Internal Medicine

## 2019-09-11 DIAGNOSIS — Z23 Encounter for immunization: Secondary | ICD-10-CM

## 2019-09-11 NOTE — Progress Notes (Signed)
   Covid-19 Vaccination Clinic  Name:  Kaitlin Gilmore    MRN: [id number] DOB: [birth year]  09/11/2019  Kaitlin Gilmore was observed post Covid-19 immunization for 15 minutes without incident. She was provided with Vaccine Information Sheet and instruction to access the V-Safe system.   Kaitlin Gilmore was instructed to call 911 with any severe reactions post vaccine: Marland Kitchen Difficulty breathing  . Swelling of face and throat  . A fast heartbeat  . A bad rash all over body  . Dizziness and weakness   Immunizations Administered    Name Date Dose VIS Date Route   Pfizer COVID-19 Vaccine 09/11/2019  2:46 PM 0.3 mL 06/12/2019 Intramuscular   Manufacturer: [employer], [employer]   Lot: [id number]   NDC: [address]-9

## 2019-10-05 ENCOUNTER — Ambulatory Visit: Payer: Self-pay | Attending: Internal Medicine

## 2019-10-05 DIAGNOSIS — Z23 Encounter for immunization: Secondary | ICD-10-CM

## 2019-10-05 NOTE — Progress Notes (Signed)
   [facility]  Name:  Charitie Hinote    MRN: [id number] DOB: September 22, 1975  10/05/2019  Ms. Alexa was observed post Covid-19 immunization for 15 minutes without incident. She was provided with Vaccine Information Sheet and instruction to access the V-Safe system.   Ms. Mclarty was instructed to call 911 with any severe reactions post vaccine: Marland Kitchen Difficulty breathing  . Swelling of face and throat  . A fast heartbeat  . A bad rash all over body  . Dizziness and weakness   Immunizations Administered    Name Date Dose VIS Date Route   Pfizer COVID-19 Vaccine 10/05/2019  4:12 PM 0.3 mL 06/12/2019 Intramuscular   Manufacturer: [employer], [employer]   Lot: [id number]   NDC: [address]-9

## 2020-08-03 ENCOUNTER — Other Ambulatory Visit: Payer: Self-pay

## 2020-08-03 ENCOUNTER — Ambulatory Visit
Admission: RE | Admit: 2020-08-03 | Discharge: 2020-08-03 | Disposition: A | Discharge status: Home / Self Care | Payer: 59 | Source: Ambulatory Visit | Attending: Internal Medicine | Admitting: Internal Medicine

## 2020-08-03 ENCOUNTER — Other Ambulatory Visit: Payer: Self-pay | Admitting: Internal Medicine

## 2020-08-03 DIAGNOSIS — R0600 Dyspnea, unspecified: Secondary | ICD-10-CM

## 2020-08-19 ENCOUNTER — Other Ambulatory Visit: Payer: Self-pay | Admitting: Nurse Practitioner

## 2020-08-19 DIAGNOSIS — N63 Unspecified lump in unspecified breast: Secondary | ICD-10-CM

## 2020-10-05 ENCOUNTER — Other Ambulatory Visit: Payer: Self-pay | Admitting: Nurse Practitioner

## 2020-10-05 ENCOUNTER — Other Ambulatory Visit: Payer: Self-pay

## 2020-10-05 ENCOUNTER — Ambulatory Visit
Admission: RE | Admit: 2020-10-05 | Discharge: 2020-10-05 | Disposition: A | Discharge status: Home / Self Care | Payer: 59 | Source: Ambulatory Visit | Attending: Nurse Practitioner | Admitting: Nurse Practitioner

## 2020-10-05 DIAGNOSIS — N63 Unspecified lump in unspecified breast: Secondary | ICD-10-CM

## 2021-09-22 ENCOUNTER — Ambulatory Visit: Payer: 59 | Admitting: Podiatrist

## 2022-05-10 IMAGING — DX DG CHEST 2V
2 series · 2 of 2 positions shown · non-contrast
Comparison: 04/01/2007

CLINICAL DATA: Dyspnea

EXAM:
CHEST - 2 VIEW

[dg chest 2 view (1 of 2)]
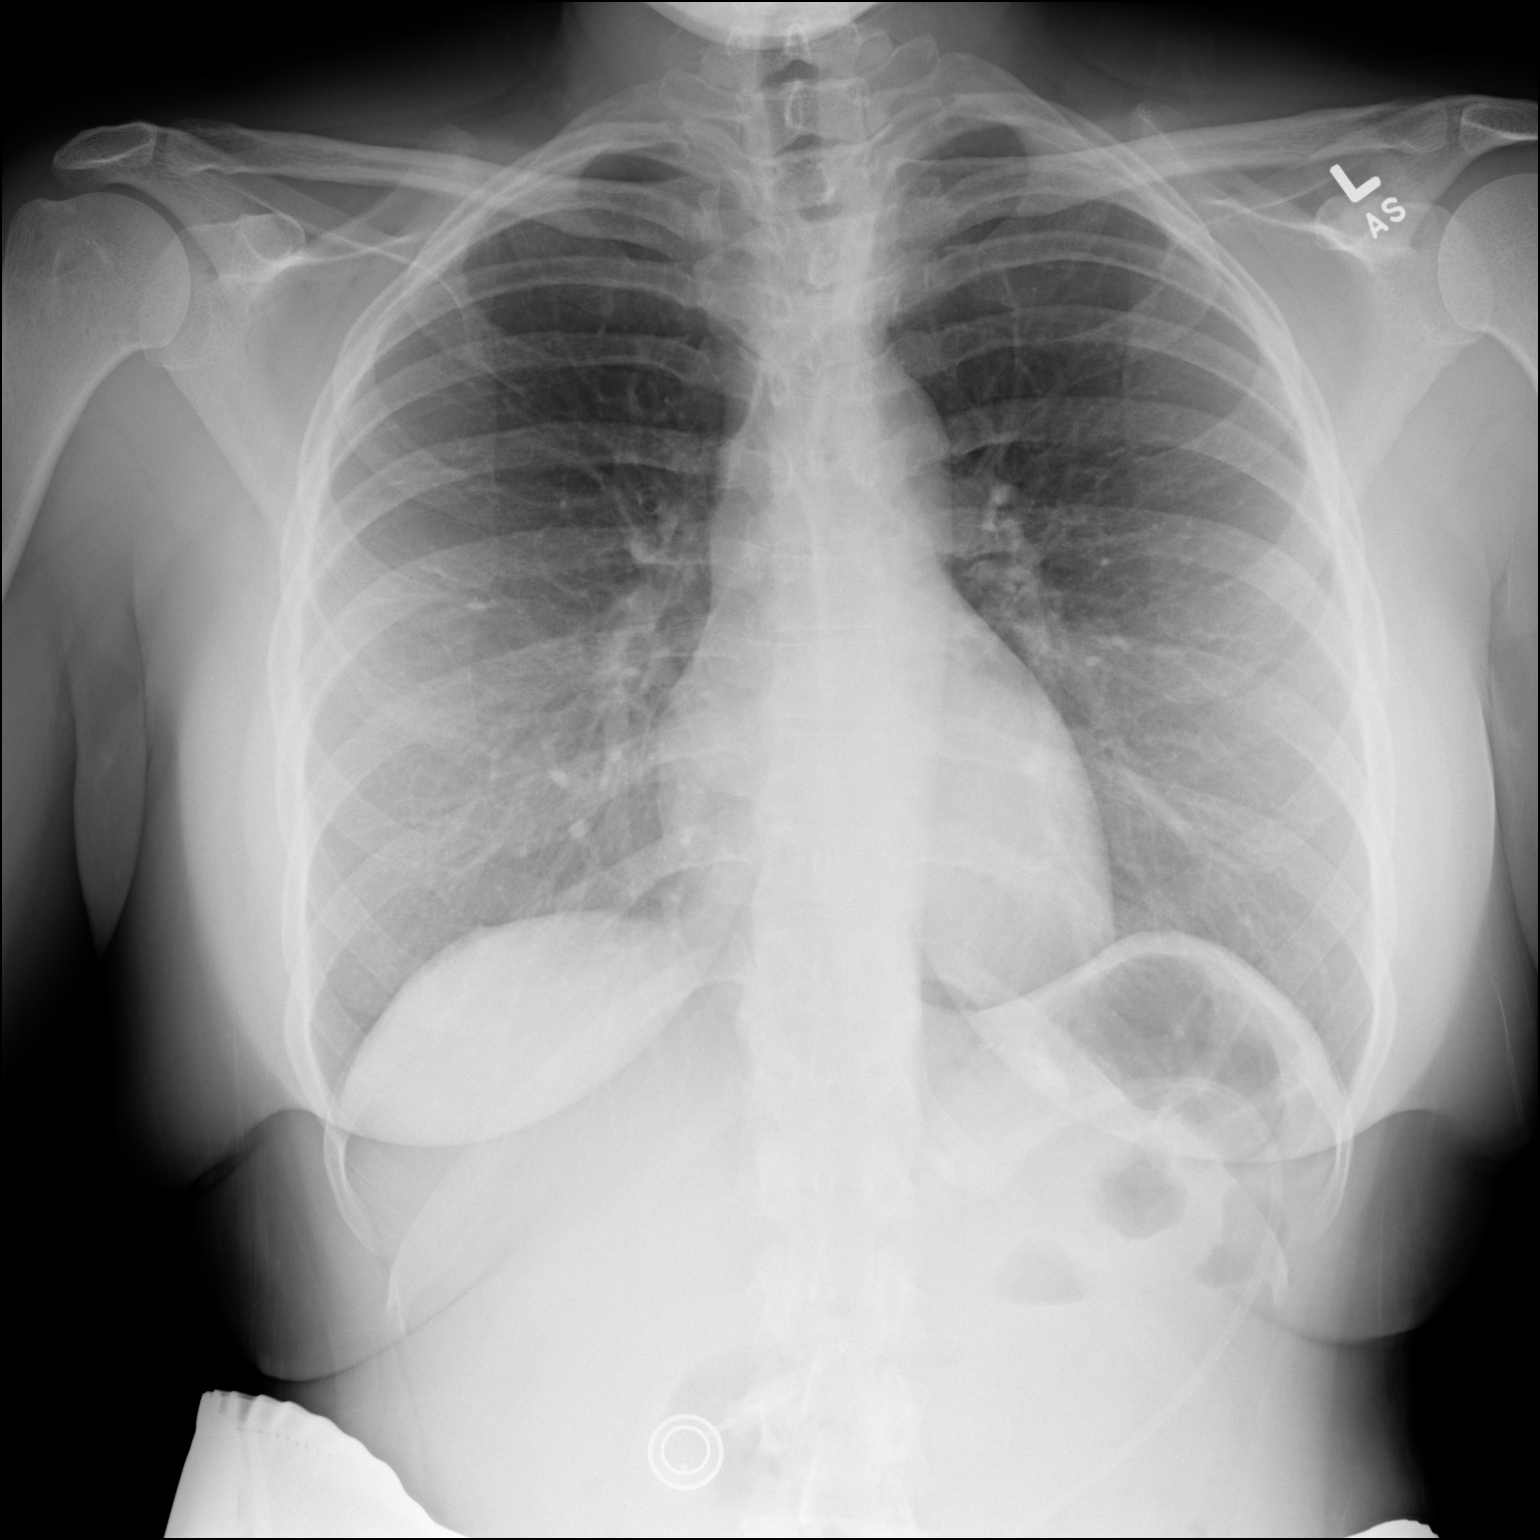

[dg chest 2 view (2 of 2)]
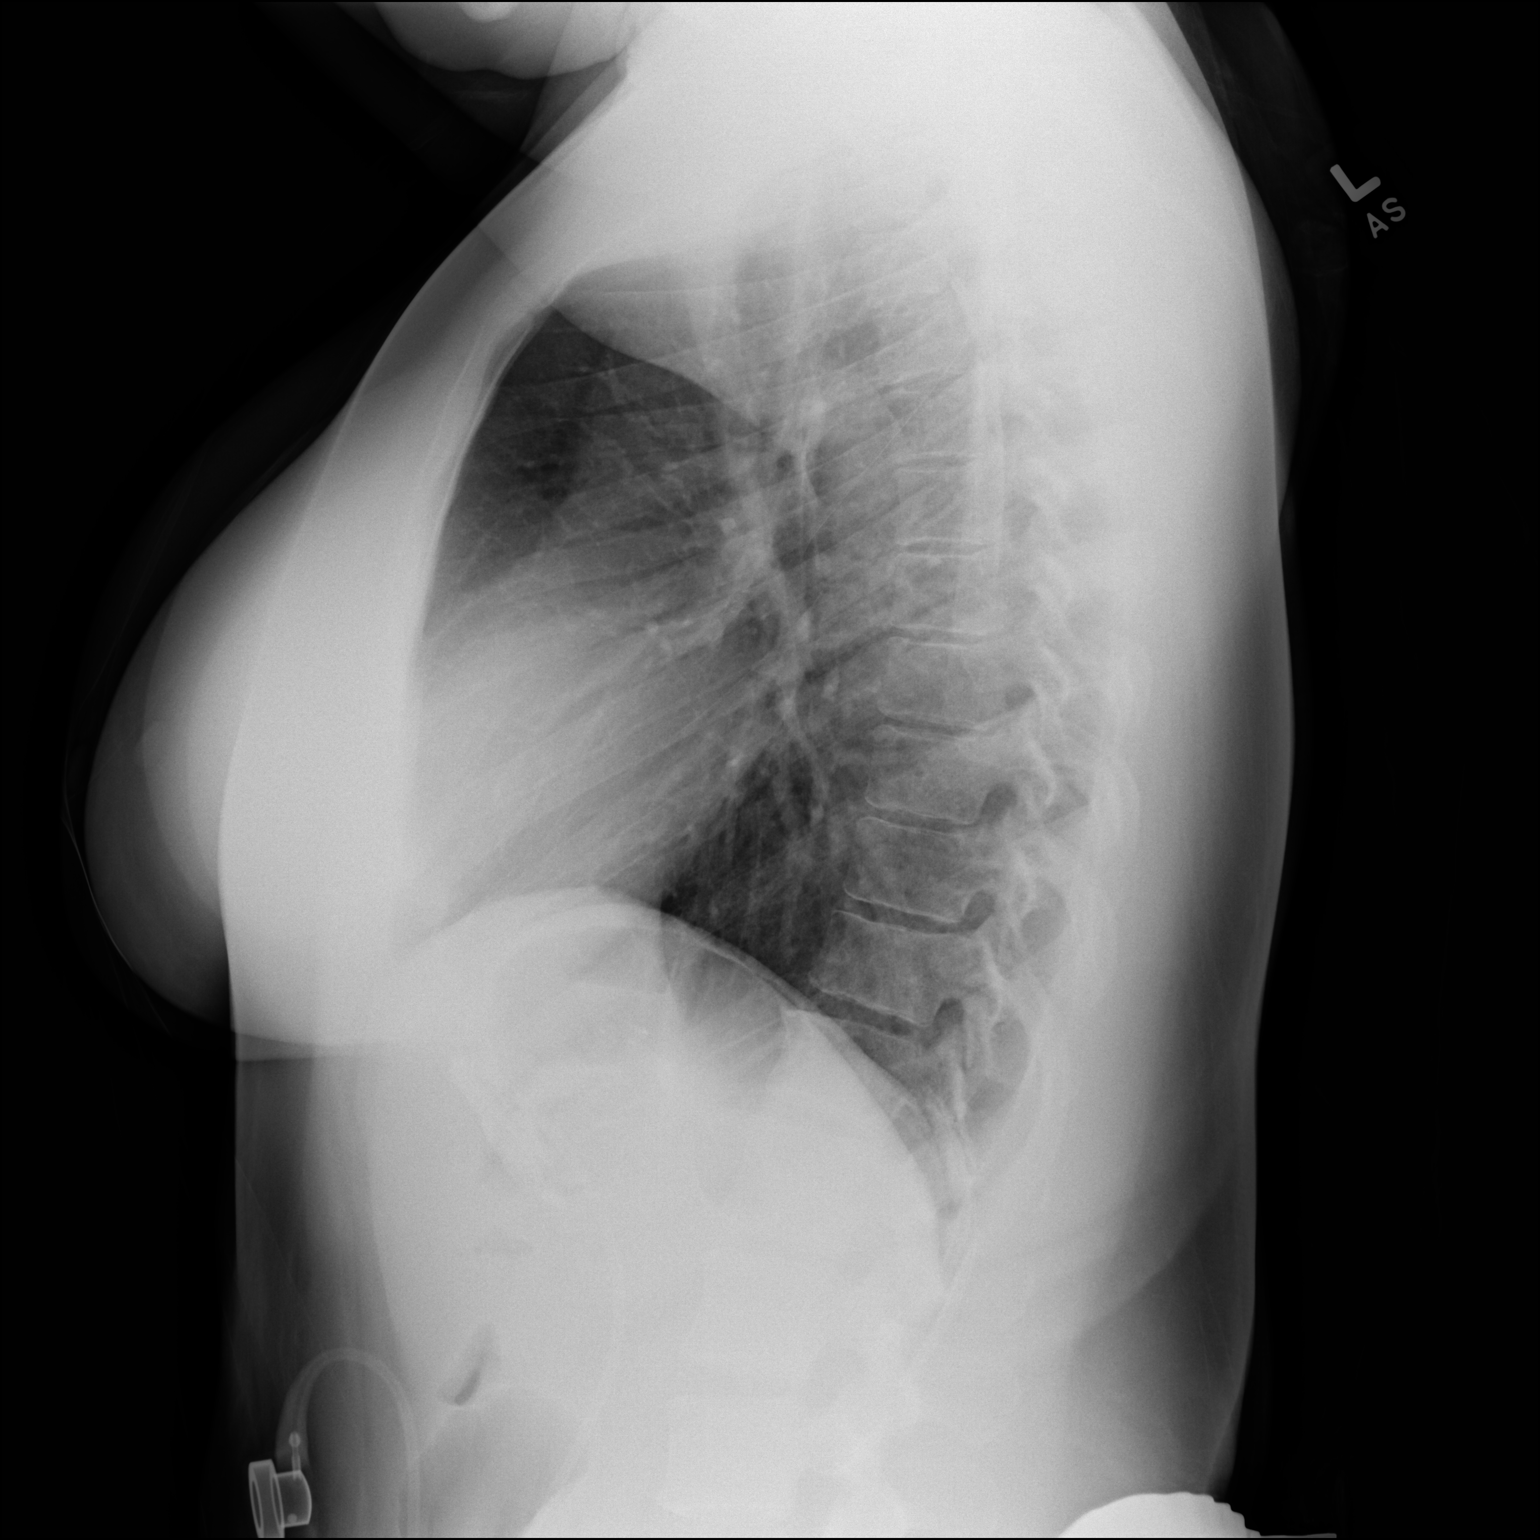

[2 of 2 positions shown; findings below may reference images not displayed]

FINDINGS: The heart size and mediastinal contours are within normal limits.
Both lungs are clear. The visualized skeletal structures are
unremarkable. Bilateral breast implants are noted. Gastric lap band
is seen.
IMPRESSION: No active cardiopulmonary disease.

## 2022-07-12 IMAGING — MG MM  DIGITAL DIAGNOSTIC BREAST BILAT IMPLANT W/ TOMO W/ CAD
8 of 18 series · 8 of 40 positions shown · non-contrast
Comparison: Previous exam(s).

CLINICAL DATA: Palpable lump in the left breast.

EXAM:
DIGITAL DIAGNOSTIC BILATERAL MAMMOGRAM WITH IMPLANTS, CAD AND
TOMOSYNTHESIS; ULTRASOUND RIGHT BREAST LIMITED; ULTRASOUND LEFT
BREAST LIMITED
TECHNIQUE: Bilateral digital diagnostic mammography and breast tomosynthesis
was performed. The images were evaluated with computer-aided
detection. Standard and/or implant displaced views were performed.;
Targeted ultrasound examination of the right breast was performed;
Targeted ultrasound examination of the left breast was performed

[L ML]
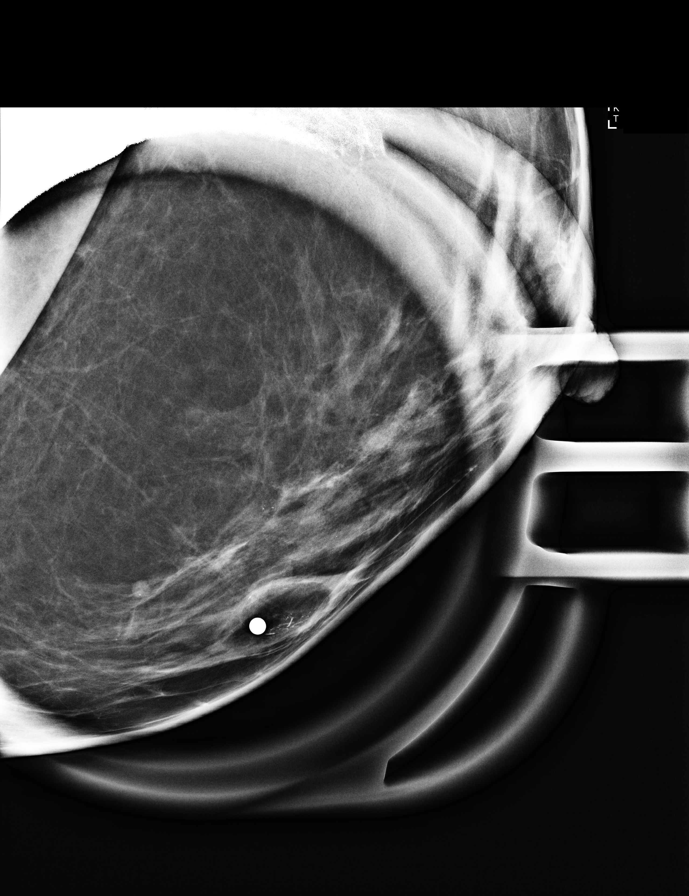

[L CC (1 of 2)]
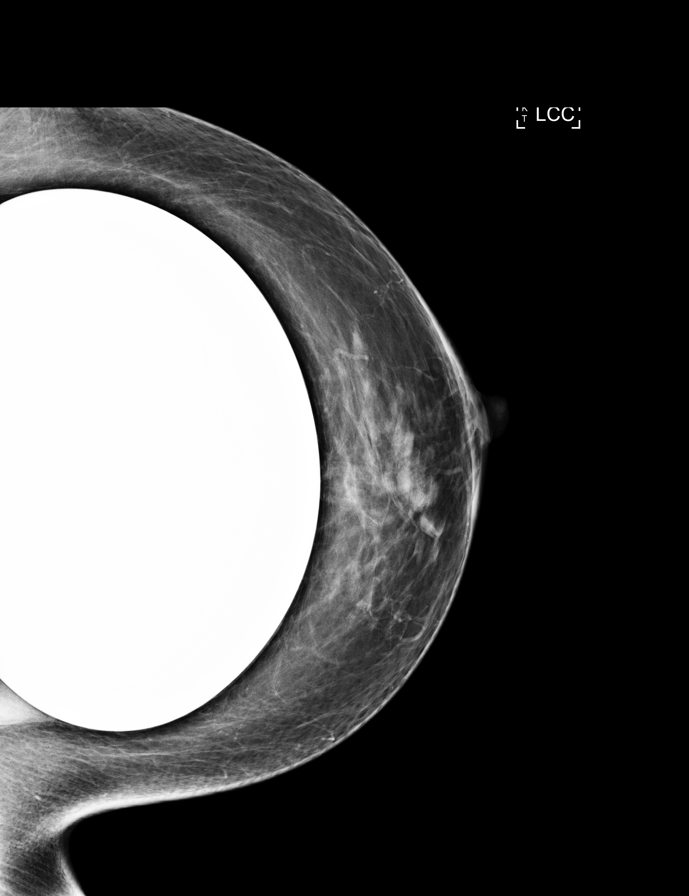

[L CC (2 of 2)]
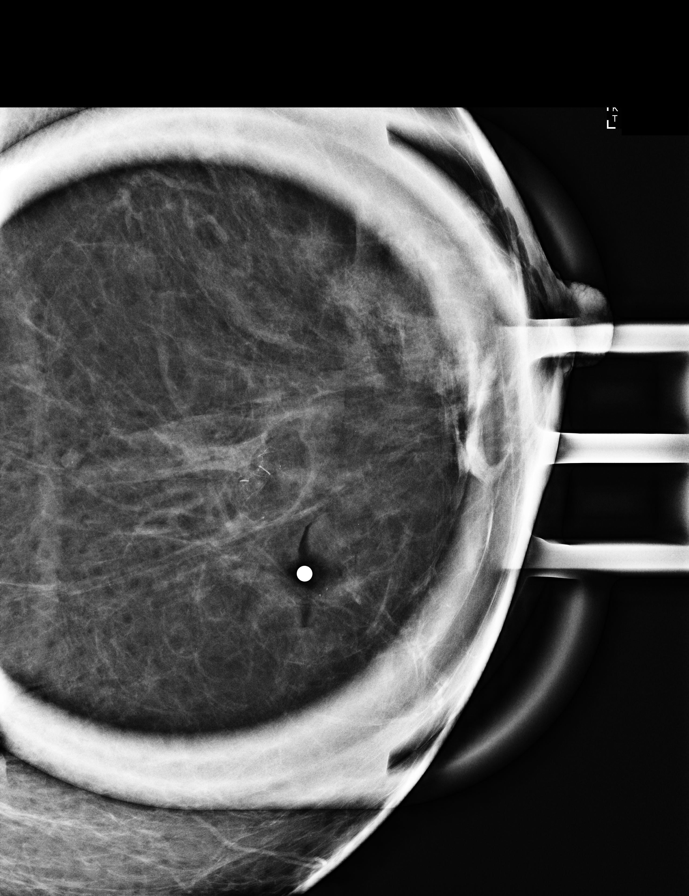

[R MLO]
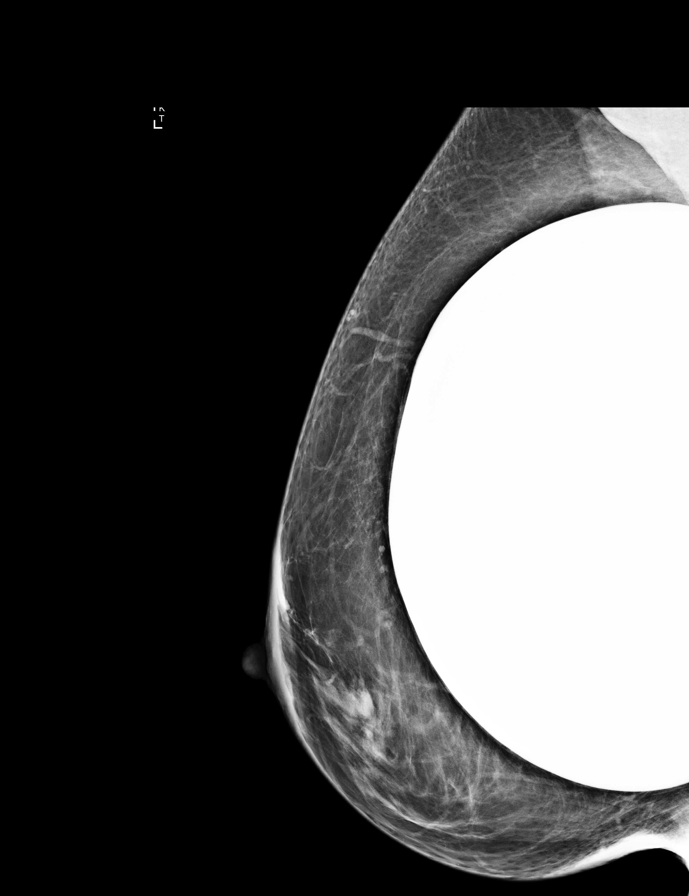

[R CC]
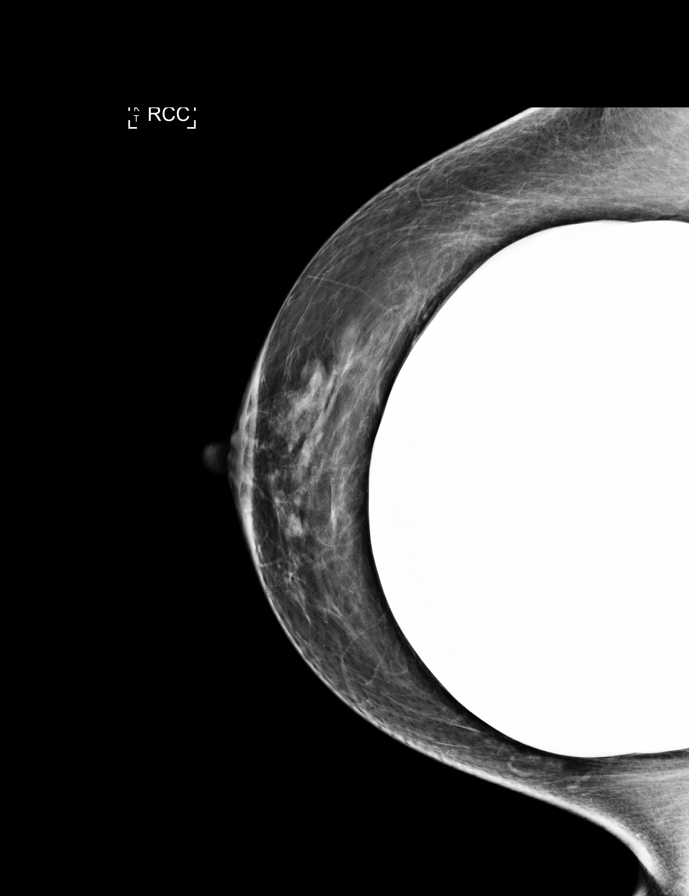

[R CC synth-2D (1 of 2)]
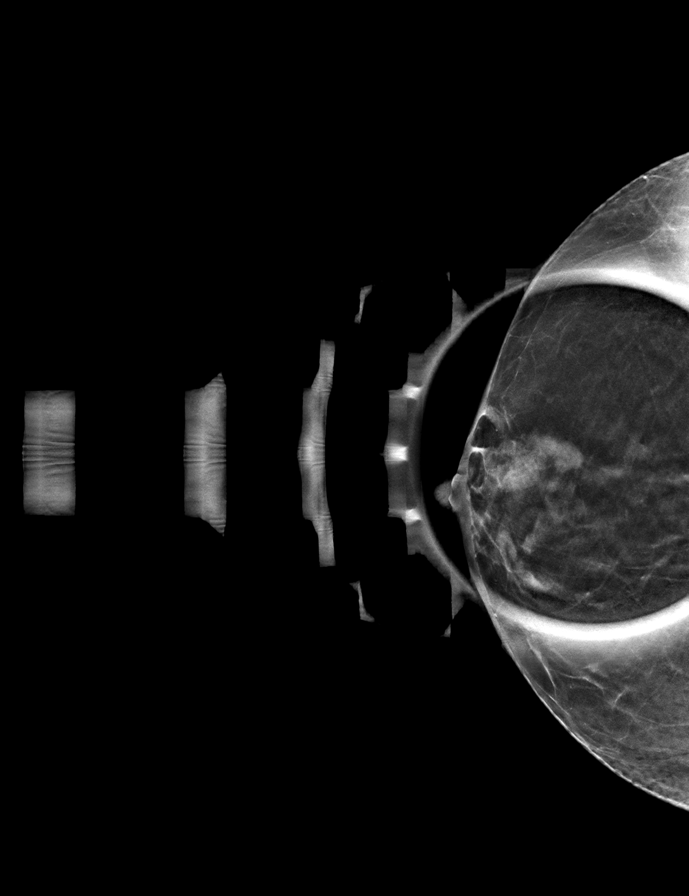

[R CC synth-2D (2 of 2)]
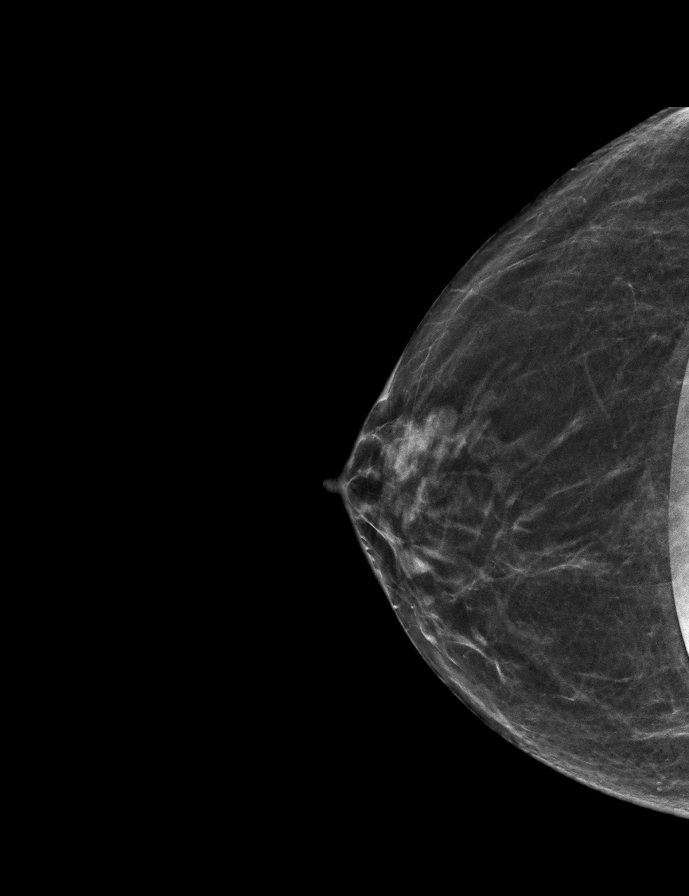

[L MLO synth-2D]
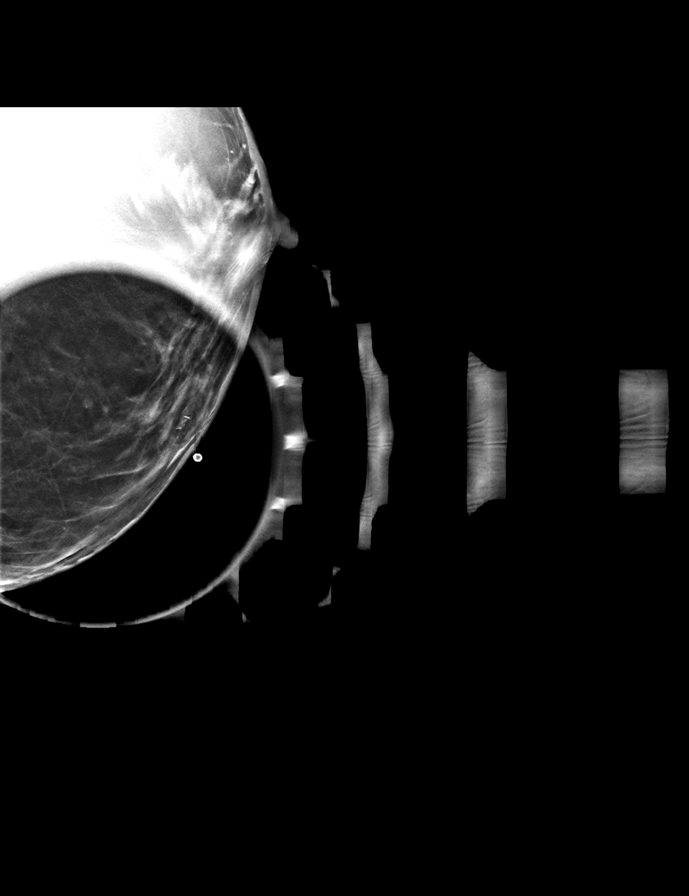

[8 of 40 positions shown; findings below may reference images not displayed]

ACR Breast Density Category b: There are scattered areas of
fibroglandular density.
FINDINGS: There is density in the right periareolar region which improves on
spot imaging. No other suspicious findings on the right.

There is a fatty mass in the region of the patient's palpable lump
on the left. Within this fatty mass are primarily linear
calcifications spanning 7 mm. In the central inferior left breast is
a small 3 mm mass containing a single punctate calcification. The
patient has retropectoral implants.

On physical exam, there is a palpable lump pointed out by the
patient.

Targeted ultrasound is performed, showing normal tissue in the right
periareolar and retroareolar regions.

There is a hypoechoic mass containing calcifications in the region
of the patient's palpable lump at [DATE], 5 cm from the nipple
measuring 1.3 x 0.6 by 1.5 cm, immediately beneath the skin with no
connection to the skin surface identified.

There is no sonographic correlate for the 6 o'clock left breast mass
seen mammographically measuring 3 mL in containing a single punctate
calcification.
IMPRESSION: There is a fatty mass containing linear calcifications spanning 7 mm
in the left breast. The fatty component of the mass suggests a
benign process. The linear calcifications could represent evolving
calcifications within focal fat necrosis. However, the linear
calcifications are indeterminate.

No mammographic or sonographic evidence of malignancy on the right.

No sonographic correlate for the 3 mm mass in the inferior left
breast at 6 o'clock containing a single punctate calcification.

RECOMMENDATION:
Recommend stereotactic biopsy of the calcifications within the fat
containing left breast mass. If stereotactic biopsy cannot be
achieved, the calcifications are seen sonographically as well and
the biopsy could be converted to a sonographically guided biopsy.
Stereotactic biopsy has been chosen to maximize the calcifications
obtain.

If the biopsy is benign, recommend six-month follow-up ultrasound of
the 6 o'clock left breast mass. If the biopsy demonstrates
malignancy or need for surgery, recommend stereotactic biopsy of the
6 o'clock left breast mass.

I have discussed the findings and recommendations with the patient.
If applicable, a reminder letter will be sent to the patient
regarding the next appointment.

BI-RADS CATEGORY  4: Suspicious.

## 2022-08-27 ENCOUNTER — Other Ambulatory Visit: Payer: Self-pay | Admitting: Nurse Practitioner

## 2022-08-27 DIAGNOSIS — N6320 Unspecified lump in the left breast, unspecified quadrant: Secondary | ICD-10-CM | POA: Diagnosis not present

## 2022-08-27 DIAGNOSIS — R921 Mammographic calcification found on diagnostic imaging of breast: Secondary | ICD-10-CM

## 2022-08-27 DIAGNOSIS — R229 Localized swelling, mass and lump, unspecified: Secondary | ICD-10-CM | POA: Diagnosis not present

## 2022-08-27 DIAGNOSIS — Z7251 High risk heterosexual behavior: Secondary | ICD-10-CM | POA: Diagnosis not present

## 2022-08-27 DIAGNOSIS — N951 Menopausal and female climacteric states: Secondary | ICD-10-CM | POA: Diagnosis not present

## 2022-08-27 DIAGNOSIS — Z9189 Other specified personal risk factors, not elsewhere classified: Secondary | ICD-10-CM | POA: Diagnosis not present

## 2022-08-27 DIAGNOSIS — Z01419 Encounter for gynecological examination (general) (routine) without abnormal findings: Secondary | ICD-10-CM | POA: Diagnosis not present

## 2022-08-27 DIAGNOSIS — N898 Other specified noninflammatory disorders of vagina: Secondary | ICD-10-CM | POA: Diagnosis not present

## 2022-09-05 ENCOUNTER — Ambulatory Visit
Admission: RE | Admit: 2022-09-05 | Discharge: 2022-09-05 | Disposition: A | Discharge status: Home / Self Care | Payer: 59 | Source: Ambulatory Visit | Attending: Nurse Practitioner | Admitting: Nurse Practitioner

## 2022-09-05 ENCOUNTER — Other Ambulatory Visit: Payer: Self-pay | Admitting: Nurse Practitioner

## 2022-09-05 DIAGNOSIS — R921 Mammographic calcification found on diagnostic imaging of breast: Secondary | ICD-10-CM

## 2022-11-13 DIAGNOSIS — N951 Menopausal and female climacteric states: Secondary | ICD-10-CM | POA: Diagnosis not present

## 2023-08-12 ENCOUNTER — Other Ambulatory Visit: Payer: Self-pay | Admitting: Family Medicine

## 2023-08-12 DIAGNOSIS — Z1231 Encounter for screening mammogram for malignant neoplasm of breast: Secondary | ICD-10-CM

## 2023-08-27 LAB — COLOGUARD: COLOGUARD: NEGATIVE
